# Patient Record
Sex: Male | Born: 1970 | Race: White | Hispanic: No | Smoking: Never smoker
Health system: Southern US, Community
[De-identification: ages and names within clinical notes are randomized; demographics above are authoritative.]

## PROBLEM LIST (undated history)

## (undated) DIAGNOSIS — Z789 Other specified health status: Secondary | ICD-10-CM

## (undated) HISTORY — PX: OTHER SURGICAL HISTORY: SHX169

## (undated) HISTORY — DX: Other specified health status: Z78.9

---

## 2003-08-13 ENCOUNTER — Inpatient Hospital Stay (HOSPITAL_COMMUNITY): Admission: EM | Admit: 2003-08-13 | Discharge: 2003-08-21 | Payer: Self-pay | Admitting: Emergency Medicine

## 2004-02-15 IMAGING — CT CT HEAD W/O CM
1 series · 16 of 30 positions shown, 20 images · non-contrast
Comparison: none

CLINICAL DATA: Cerebellar mass resection. 
CT HEAD WITHOUT CONTRAST 08/18/03
Comparison to 08/17/03. 
Left suboccipital craniectomy is again noted with stable postoperative changes and edema within the left cerebellum.  Thrombosed vessel within the left cerebellum on image 10 is stable.  Right frontal ventriculostomy catheter is again noted.  Stable ventricular size. 
IMPRESSION
Stable CT of the head.

[Series 2: trauma head · axial · 0.47mm/px · z∈[+168,+309]mm · 16 of 30 slices shown, 20 images]
[im 2/30  brain]
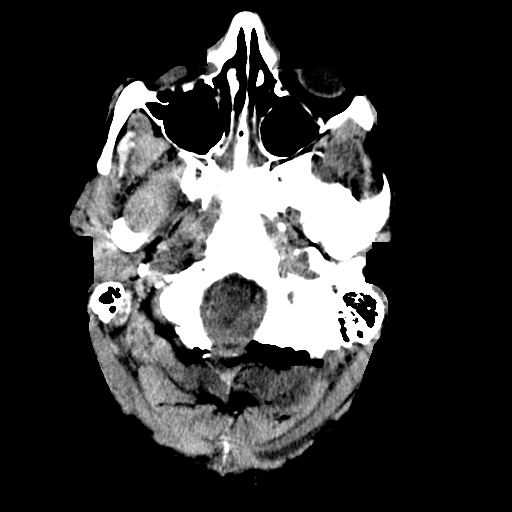
[im 2/30  bone]
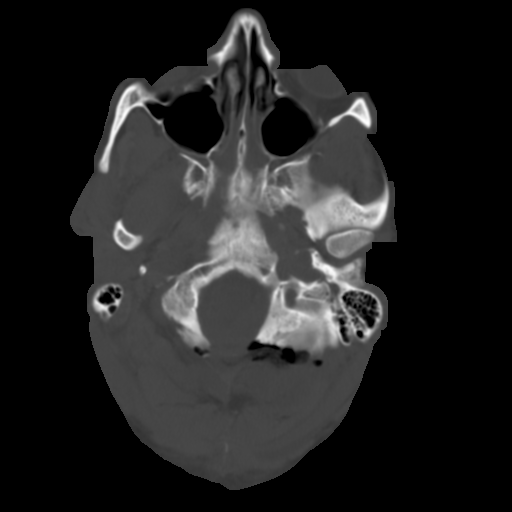
[im 4/30  brain]
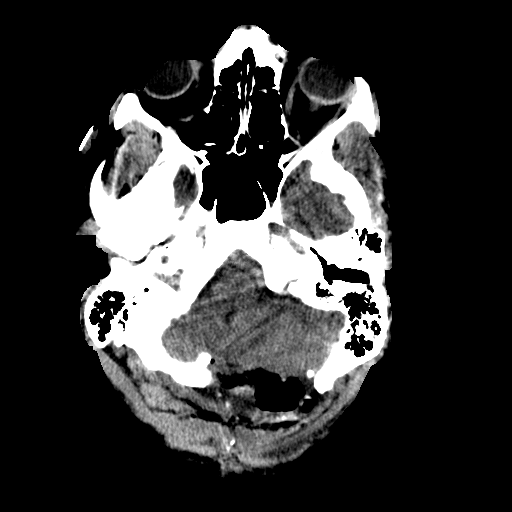
[im 6/30  brain]
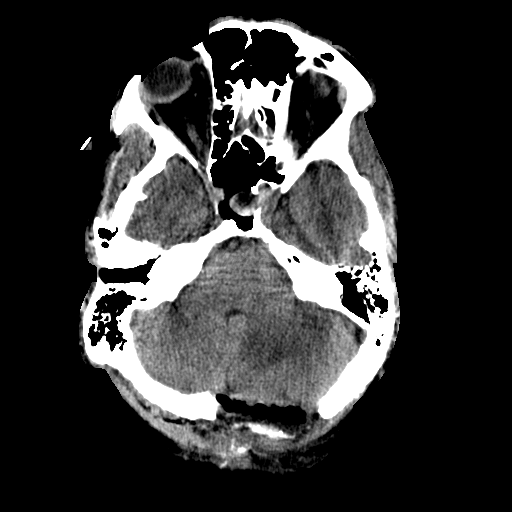
[im 8/30  brain]
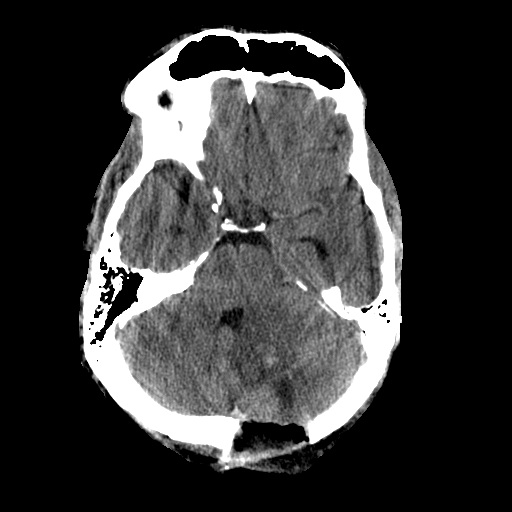
[im 9/30  brain]
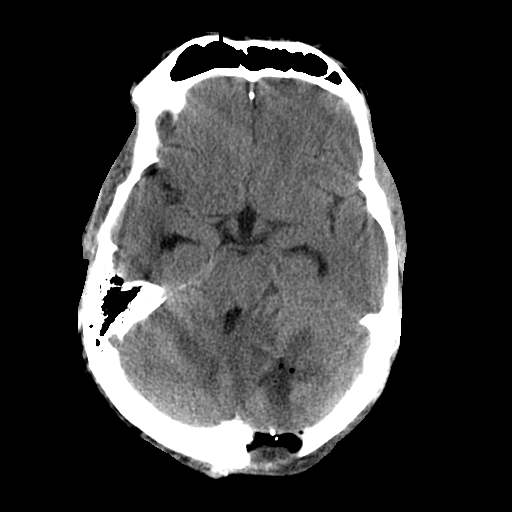
[im 9/30  bone]
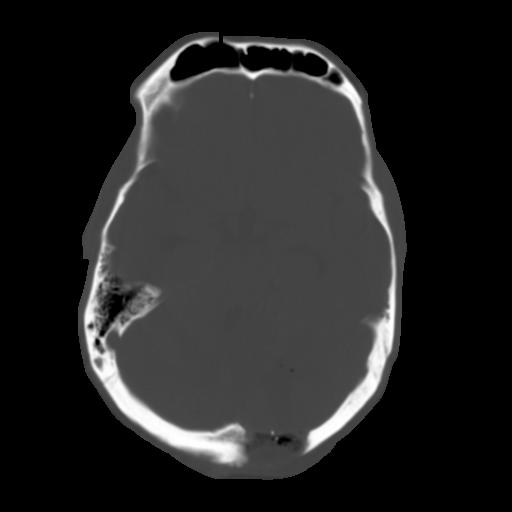
[im 11/30  brain]
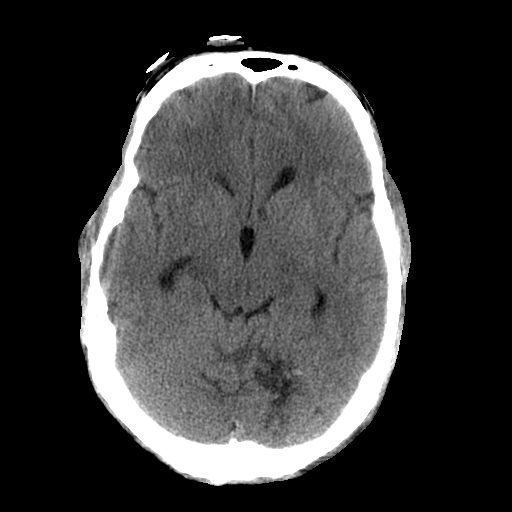
[im 13/30  brain]
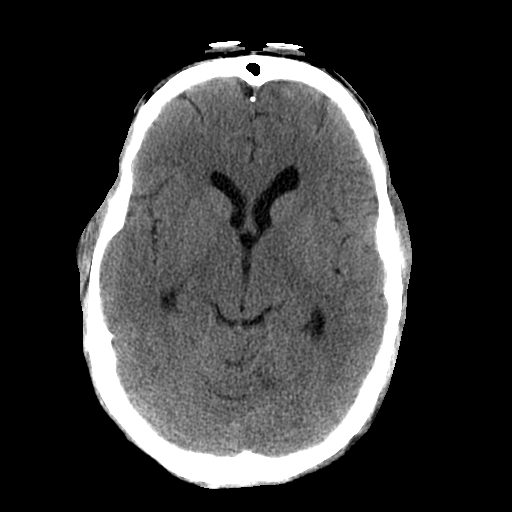
[im 15/30  brain]
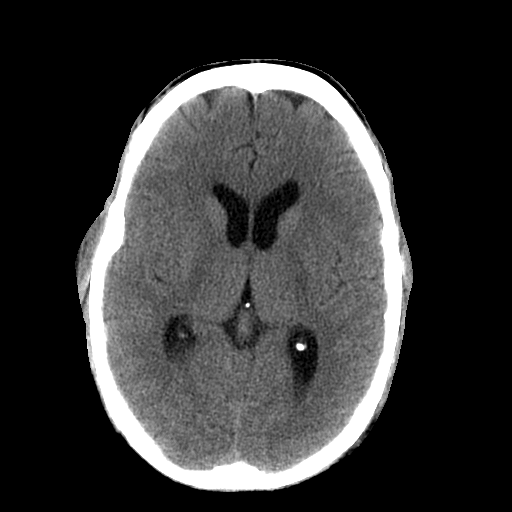
[im 16/30  brain]
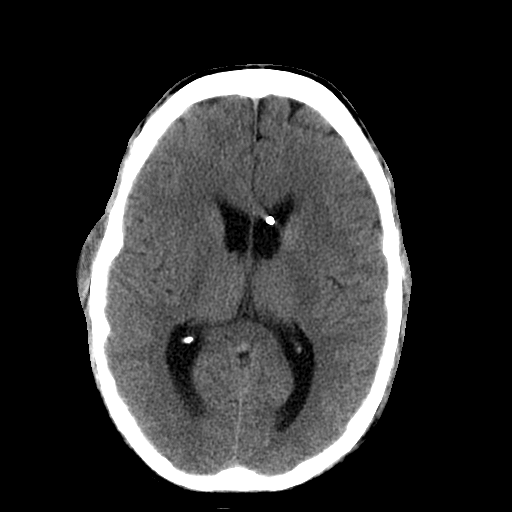
[im 16/30  bone]
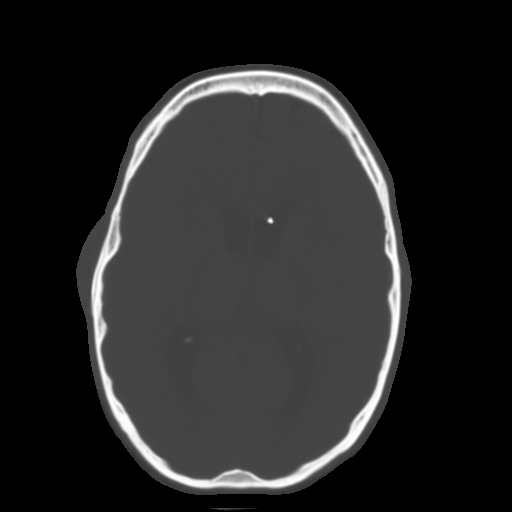
[im 18/30  brain]
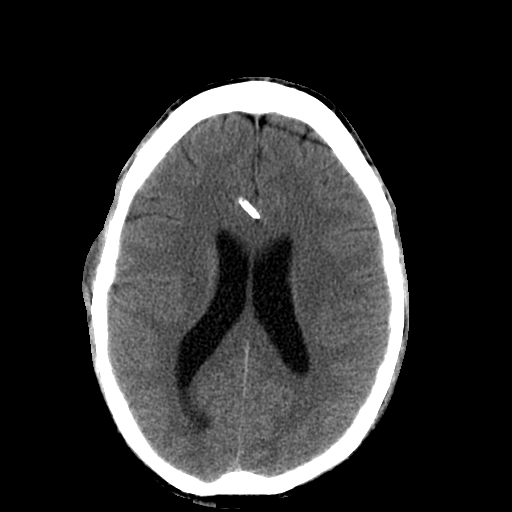
[im 20/30  brain]
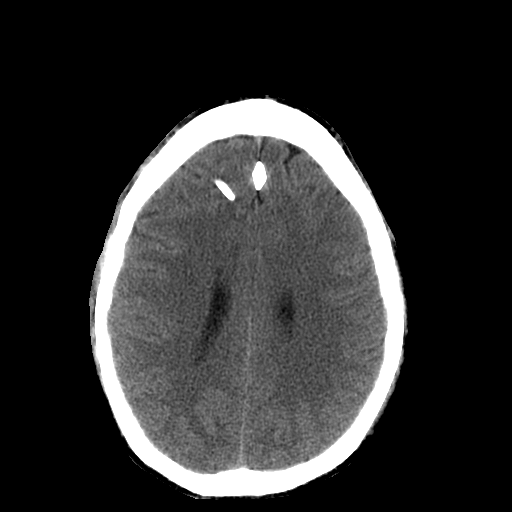
[im 22/30  brain]
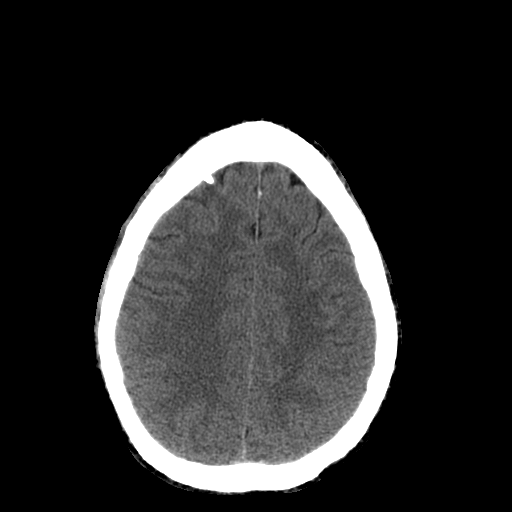
[im 23/30  brain]
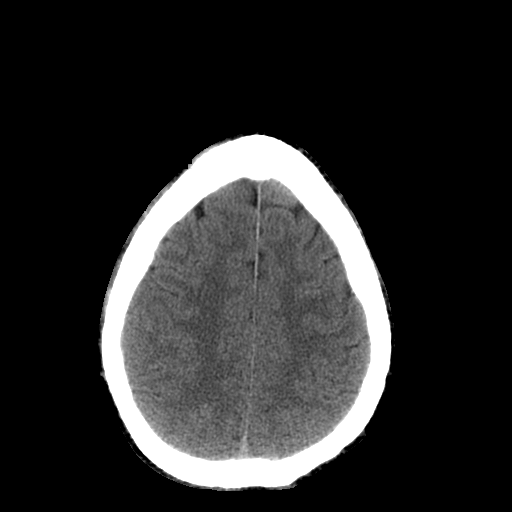
[im 23/30  bone]
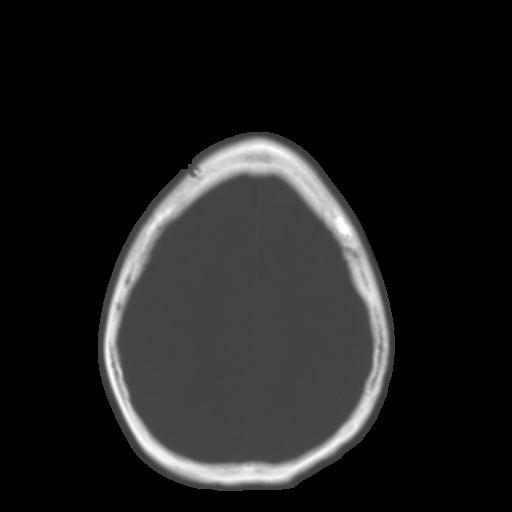
[im 25/30  brain]
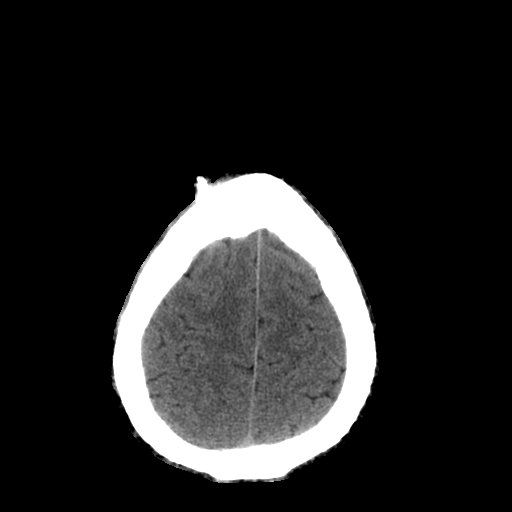
[im 27/30  brain]
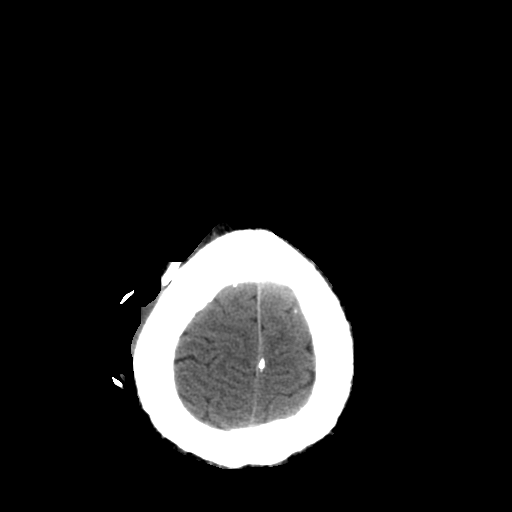
[im 29/30  brain]
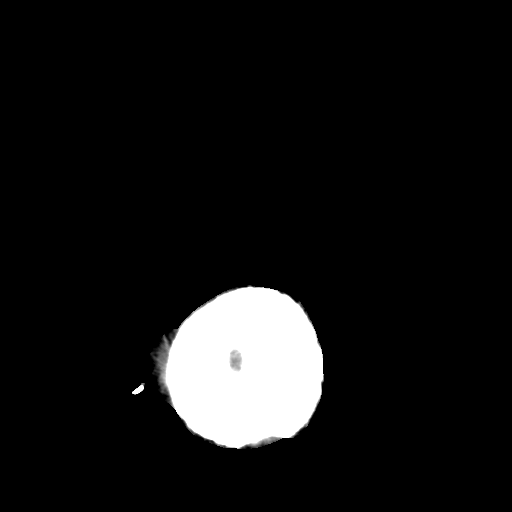

[16 of 30 positions shown; findings below may reference images not displayed]

## 2010-11-05 ENCOUNTER — Other Ambulatory Visit: Payer: Self-pay

## 2016-07-27 DIAGNOSIS — B070 Plantar wart: Secondary | ICD-10-CM | POA: Diagnosis not present

## 2017-04-14 DIAGNOSIS — N453 Epididymo-orchitis: Secondary | ICD-10-CM | POA: Diagnosis not present

## 2019-06-14 DIAGNOSIS — M545 Low back pain: Secondary | ICD-10-CM | POA: Diagnosis not present

## 2019-08-16 DIAGNOSIS — M545 Low back pain: Secondary | ICD-10-CM | POA: Diagnosis not present

## 2020-02-01 DIAGNOSIS — Z20828 Contact with and (suspected) exposure to other viral communicable diseases: Secondary | ICD-10-CM | POA: Diagnosis not present

## 2020-02-01 DIAGNOSIS — Z03818 Encounter for observation for suspected exposure to other biological agents ruled out: Secondary | ICD-10-CM | POA: Diagnosis not present

## 2020-03-18 DIAGNOSIS — Z03818 Encounter for observation for suspected exposure to other biological agents ruled out: Secondary | ICD-10-CM | POA: Diagnosis not present

## 2020-03-18 DIAGNOSIS — Z20822 Contact with and (suspected) exposure to covid-19: Secondary | ICD-10-CM | POA: Diagnosis not present

## 2022-01-14 DIAGNOSIS — Z9889 Other specified postprocedural states: Secondary | ICD-10-CM | POA: Diagnosis not present

## 2022-01-14 DIAGNOSIS — M1731 Unilateral post-traumatic osteoarthritis, right knee: Secondary | ICD-10-CM | POA: Diagnosis not present

## 2022-10-08 DIAGNOSIS — F411 Generalized anxiety disorder: Secondary | ICD-10-CM | POA: Diagnosis not present

## 2022-10-21 ENCOUNTER — Ambulatory Visit (INDEPENDENT_AMBULATORY_CARE_PROVIDER_SITE_OTHER): Payer: BC Managed Care – PPO | Admitting: Urology

## 2022-10-21 ENCOUNTER — Encounter: Payer: Self-pay | Admitting: Urology

## 2022-10-21 VITALS — BP 132/85 | HR 55 | Ht 73.0 in | Wt 210.0 lb

## 2022-10-21 DIAGNOSIS — Z3009 Encounter for other general counseling and advice on contraception: Secondary | ICD-10-CM

## 2022-10-21 MED ORDER — ALPRAZOLAM 1 MG PO TABS: ORAL_TABLET | ORAL | 0 refills | Status: AC

## 2022-10-21 NOTE — Progress Notes (Signed)
Assessment: 1. Encounter for vasectomy assessment     Plan: Rx for alprazolam pre-procedure provided Schedule for vasectomy per patient request  Chief Complaint:  Chief Complaint  Patient presents with   VAS Consult    History of Present Illness:  Jack Kelly is a 52 y.o. male who is seen for vasectomy evaluation.  He is married with 6 children.  His wife is 92 years old.  He has a remote history of epididymitis.  No history of scrotal trauma.   Past Medical History:  Past Medical History:  Diagnosis Date   No known health problems     Past Surgical History:  Past Surgical History:  Procedure Laterality Date   None      Allergies:  No Known Allergies  Family History:  History reviewed. No pertinent family history.  Social History:  Social History   Tobacco Use   Smoking status: Never   Smokeless tobacco: Never  Substance Use Topics   Alcohol use: Not Currently    Review of symptoms:  Constitutional:  Negative for unexplained weight loss, night sweats, fever, chills ENT:  Negative for nose bleeds, sinus pain, painful swallowing CV:  Negative for chest pain, shortness of breath, exercise intolerance, palpitations, loss of consciousness Resp:  Negative for cough, wheezing, shortness of breath GI:  Negative for nausea, vomiting, diarrhea, bloody stools GU:  Positives noted in HPI; otherwise negative for gross hematuria, dysuria, urinary incontinence Neuro:  Negative for seizures, poor balance, limb weakness, slurred speech Psych:  Negative for lack of energy, depression, anxiety Endocrine:  Negative for polydipsia, polyuria, symptoms of hypoglycemia (dizziness, hunger, sweating) Hematologic:  Negative for anemia, purpura, petechia, prolonged or excessive bleeding, use of anticoagulants  Allergic:  Negative for difficulty breathing or choking as a result of exposure to anything; no shellfish allergy; no allergic response (rash/itch) to materials,  foods  Physical exam: BP 132/85   Pulse (!) 55   Ht 6\' 1"  (1.854 m)   Wt 210 lb (95.3 kg)   BMI 27.71 kg/m  GENERAL APPEARANCE:  Well appearing, well developed, well nourished, NAD HEENT:  Atraumatic, normocephalic, oropharynx clear NECK:  Supple without lymphadenopathy or thyromegaly ABDOMEN:  Soft, non-tender, no masses EXTREMITIES:  Moves all extremities well, without clubbing, cyanosis, or edema NEUROLOGIC:  Alert and oriented x 3, normal gait, CN II-XII grossly intact MENTAL STATUS:  appropriate BACK:  Non-tender to palpation, No CVAT SKIN:  Warm, dry, and intact GU: Penis:  uncircumcised Meatus: Normal Scrotum: vas palpated bilaterally Testis: normal without masses bilateral Epididymis: normal    Results: None  VASECTOMY CONSULTATION  [name] presents for vasectomy consultation today.  He is a 52 y.o. male, Married with 6  children .  He and his wife have discussed the issues regarding long-term fertility and are comfortable with this decision.  He presents for consideration for vasectomy.  I discussed the issues in detail with him today and he expressed no reservations.  As to the procedure, no scalpel technique vasectomy is explained and reviewed in detail.  Generalized risks including but not limited to bleeding, infection, orchalgia, testicular atrophy, epididymitis, scrotal hematoma, and chronic pain are discussed.   Additionally, he understands that the possibility of vas recanalization following vasectomy is possible although rare.  Most importantly, the patient understands that he is not sterile initially and will need a semen analysis check to confirm sterility such that no sperm are seen.  He is advised to avoid ejaculation for 10 days following  the procedure.  The initial semen analysis will be checked in approximately 12 weeks and in some patients, several months may be required for clearance of all sperm.  He reports a clear understanding of the need for  continued birth control until sterility is confirmed.  Otherwise, general issues regarding local anesthesia, prep, alprazolam are discussed and he reports a clear understanding.

## 2022-10-21 NOTE — Patient Instructions (Signed)
Taking care of yourself after a VASECTOMY                                              Patient Information Sheet        The following information will reinforce some of the instructions that your doctor has given you.  Day of Procedure: 1) Wear the scrotal supporter and gauze pad 2) Use an ice pack on the scrotum for 15 minutes every hour for 48 hours to help reduce discomfort, swelling and bruising (do NOT place ice directly on your skin, but place on top of the supporter) 3) Expect some clear to pinkish drainage at the surgical site for the first 24-48 hours 4) If needed, use pain medications provided or ibuprofen 800 mg every 8 hours for discomfort 5) Avoid strenuous activities like mowing, lifting, jogging and exercising for 1 week.  Take it easy! 6) If you develop a fever over 101 F or sudden onset of significant swelling within the first 12 hours, please call to report this to your doctor as soon as possible.     Day Two and Three: 1) You may take a shower, but avoid tubs, pools or hot tubs. 2) Continue to wear the scrotal supporter as needed for comfort and change or remove the gauze pad if desired 3) Keep taking it easy!  Avoid strenuous activities like mowing, lifting, jogging and exercising.   4) Continue to watch for signs or symptoms of fever or significant swelling 5) Apply a small amount of antibiotic ointment to incision 1-2 times/day  The rest of the week: 1) Gradually return to normal physical activities after one week.  A return of soreness might mean you are        "doing too much too soon". 2) Avoid sexual activity for 10 days after the procedure 3) Continue to take a shower, but avoid tubs, pools or hot tubs 4) Wearing the scrotal supporter is optional based on your comfort.     Remember to use an alternate form of contraception for 3 months until you have been checked and CLEARED by your urologist!   

## 2022-11-15 ENCOUNTER — Encounter: Payer: Self-pay | Admitting: Urology

## 2022-11-15 ENCOUNTER — Ambulatory Visit (INDEPENDENT_AMBULATORY_CARE_PROVIDER_SITE_OTHER): Payer: BC Managed Care – PPO | Admitting: Urology

## 2022-11-15 VITALS — BP 107/69 | HR 58 | Ht 73.0 in | Wt 210.0 lb

## 2022-11-15 DIAGNOSIS — Z302 Encounter for sterilization: Secondary | ICD-10-CM

## 2022-11-15 MED ORDER — HYDROCODONE-ACETAMINOPHEN 5-325 MG PO TABS: 1.0000 | ORAL_TABLET | Freq: Four times a day (QID) | ORAL | 0 refills | Status: AC | PRN

## 2022-11-15 MED ORDER — CEPHALEXIN 500 MG PO CAPS: 500.0000 mg | ORAL_CAPSULE | Freq: Three times a day (TID) | ORAL | 0 refills | Status: AC

## 2022-11-15 NOTE — Patient Instructions (Signed)
Taking care of yourself after a VASECTOMY                                              Patient Information Sheet        The following information will reinforce some of the instructions that your doctor has given you.  Day of Procedure: 1) Wear the scrotal supporter and gauze pad 2) Use an ice pack on the scrotum for 15 minutes every hour for 48 hours to help reduce discomfort, swelling and bruising (do NOT place ice directly on your skin, but place on top of the supporter) 3) Expect some clear to pinkish drainage at the surgical site for the first 24-48 hours 4) If needed, use pain medications provided or ibuprofen 800 mg every 8 hours for discomfort 5) Avoid strenuous activities like mowing, lifting, jogging and exercising for 1 week.  Take it easy! 6) If you develop a fever over 101 F or sudden onset of significant swelling within the first 12 hours, please call to report this to your doctor as soon as possible.     Day Two and Three: 1) You may take a shower, but avoid tubs, pools or hot tubs. 2) Continue to wear the scrotal supporter as needed for comfort and change or remove the gauze pad if desired 3) Keep taking it easy!  Avoid strenuous activities like mowing, lifting, jogging and exercising.   4) Continue to watch for signs or symptoms of fever or significant swelling 5) Apply a small amount of antibiotic ointment to incision 1-2 times/day  The rest of the week: 1) Gradually return to normal physical activities after one week.  A return of soreness might mean you are        "doing too much too soon". 2) Avoid sexual activity for 10 days after the procedure 3) Continue to take a shower, but avoid tubs, pools or hot tubs 4) Wearing the scrotal supporter is optional based on your comfort.     Remember to use an alternate form of contraception for 3 months until you have been checked and CLEARED by your urologist!  51-Month lab appointment:  1) The lab technician will need to  look at a semen sample under a microscope  2) Use the specimen cup provided to collect the sample AT HOME 1 hour before the appointment  3) DO NOT refrigerate the specimen, but keep at room or body temperature  4) Avoid ejaculation for 2-5 days before collecting the specimen  5) Collect the entire specimen by masturbation using NO lubricant  6) Make sure your name, MR number, date and time of collection are on the cup

## 2022-11-15 NOTE — Progress Notes (Signed)
  Assessment: 1. Encounter for vasectomy     Plan: Post vasectomy instructions given Rx provided Post vasectomy semen analysis is 12 weeks  Chief Complaint:  Chief Complaint  Patient presents with   VAS    History of Present Illness:  Jack Kelly is a 52 y.o. male who is seen for vasectomy.  He is married with 6 children.  His wife is 49 years old.  He has a remote history of epididymitis.  No history of scrotal trauma.   Past Medical History:  Past Medical History:  Diagnosis Date   No known health problems     Past Surgical History:  Past Surgical History:  Procedure Laterality Date   None      Allergies:  No Known Allergies  Family History:  History reviewed. No pertinent family history.  Social History:  Social History   Tobacco Use   Smoking status: Never   Smokeless tobacco: Never  Substance Use Topics   Alcohol use: Not Currently    ROS: Constitutional:  Negative for fever, chills, weight loss CV: Negative for chest pain, previous MI, hypertension Respiratory:  Negative for shortness of breath, wheezing, sleep apnea, frequent cough GI:  Negative for nausea, vomiting, bloody stool, GERD  Physical exam: BP 107/69   Pulse (!) 58   Ht 6' 1"$  (1.854 m)   Wt 210 lb (95.3 kg)   BMI 27.71 kg/m  GENERAL APPEARANCE:  Well appearing, well developed, well nourished, NAD HEENT:  Atraumatic, normocephalic, oropharynx clear NECK:  Supple without lymphadenopathy or thyromegaly ABDOMEN:  Soft, non-tender, no masses EXTREMITIES:  Moves all extremities well, without clubbing, cyanosis, or edema NEUROLOGIC:  Alert and oriented x 3, normal gait, CN II-XII grossly intact MENTAL STATUS:  appropriate BACK:  Non-tender to palpation, No CVAT SKIN:  Warm, dry, and intact    Results: None  VASECTOMY PROCEDURE:  MARKEISE MONTVILLE presents for vasectomy following previous vasectomy consultation and permit is signed.  The patient's anterior scrotal wall is shaved and  prepped with Betadine in standard sterile fashion.  1% lidocaine is used as local anesthetic in the scrotal and peri vasal tissue.  A standard median raphe punch incision is made and a no scalpel technique vasectomy is performed.  Bilateral vas are isolated from the peri vasal tissue and an approximately 1 cm segment of vas is excised.  Proximal and distal segments are internally cauterized with electric heat cautery. Interposition of perivasal tissue was performed.  Bilateral palpation confirms bilateral vasectomy defect and no significant bleeding or hematoma is identified.  Neosporin gauze dressing and a scrotal support are applied.    Disposition: Patient is discharged home with Rx for pain medication and antibiotics.  Patient is given routine vasectomy instructions.   Most importantly, he is instructed and cautioned again regarding the need for protected intercourse until such time that a single  negative semen analysis has been obtained.  The initial semen analysis will be checked in approximately 12  weeks.  The patient reports a clear understanding.  He will call with any interval questions or concerns.

## 2022-12-06 ENCOUNTER — Telehealth: Payer: Self-pay | Admitting: Urology

## 2022-12-06 ENCOUNTER — Other Ambulatory Visit: Payer: Self-pay | Admitting: Urology

## 2022-12-06 MED ORDER — DOXYCYCLINE HYCLATE 100 MG PO CAPS: 100.0000 mg | ORAL_CAPSULE | Freq: Two times a day (BID) | ORAL | 0 refills | Status: AC

## 2022-12-06 MED ORDER — MELOXICAM 7.5 MG PO TABS: 7.5000 mg | ORAL_TABLET | Freq: Every day | ORAL | 1 refills | Status: AC

## 2022-12-06 NOTE — Telephone Encounter (Signed)
Patient had a Vasectomy done on 11/15/2022 & called stating that he is having some complications with this & having quite a bit of soreness and has some questions in regards to this and would like a call back.   Patients call back #: [phone]

## 2023-01-12 DIAGNOSIS — Z832 Family history of diseases of the blood and blood-forming organs and certain disorders involving the immune mechanism: Secondary | ICD-10-CM | POA: Diagnosis not present

## 2023-01-12 DIAGNOSIS — F411 Generalized anxiety disorder: Secondary | ICD-10-CM | POA: Diagnosis not present

## 2023-01-12 DIAGNOSIS — R4189 Other symptoms and signs involving cognitive functions and awareness: Secondary | ICD-10-CM | POA: Diagnosis not present

## 2023-01-18 ENCOUNTER — Other Ambulatory Visit: Payer: Self-pay

## 2023-01-18 DIAGNOSIS — Z302 Encounter for sterilization: Secondary | ICD-10-CM

## 2023-02-03 ENCOUNTER — Other Ambulatory Visit: Payer: BC Managed Care – PPO

## 2023-02-07 ENCOUNTER — Other Ambulatory Visit: Payer: BC Managed Care – PPO

## 2023-02-08 ENCOUNTER — Other Ambulatory Visit: Payer: BC Managed Care – PPO

## 2023-02-10 ENCOUNTER — Other Ambulatory Visit: Payer: Self-pay

## 2023-02-10 ENCOUNTER — Other Ambulatory Visit: Payer: BC Managed Care – PPO

## 2023-02-10 DIAGNOSIS — Z302 Encounter for sterilization: Secondary | ICD-10-CM | POA: Diagnosis not present

## 2023-02-11 ENCOUNTER — Telehealth: Payer: Self-pay | Admitting: Urology

## 2023-02-11 LAB — POST-VAS SPERM EVALUATION,QUAL: Volume: 3.2 mL

## 2023-02-11 NOTE — Telephone Encounter (Signed)
Pt seeking semen analysis results. Advised pt results are not back yet, we will notify him when they are. Pt expressed understanding.

## 2023-02-11 NOTE — Telephone Encounter (Signed)
Patient called in regards to his lab results & wanted a call back about this.   Patients callback #: [phone]

## 2023-03-02 DIAGNOSIS — G4719 Other hypersomnia: Secondary | ICD-10-CM | POA: Diagnosis not present

## 2023-04-21 DIAGNOSIS — F411 Generalized anxiety disorder: Secondary | ICD-10-CM | POA: Diagnosis not present

## 2023-04-21 DIAGNOSIS — G4733 Obstructive sleep apnea (adult) (pediatric): Secondary | ICD-10-CM | POA: Diagnosis not present

## 2023-07-09 DIAGNOSIS — F4320 Adjustment disorder, unspecified: Secondary | ICD-10-CM | POA: Diagnosis not present

## 2023-07-15 DIAGNOSIS — F4320 Adjustment disorder, unspecified: Secondary | ICD-10-CM | POA: Diagnosis not present

## 2023-07-25 DIAGNOSIS — F4320 Adjustment disorder, unspecified: Secondary | ICD-10-CM | POA: Diagnosis not present

## 2023-11-08 DIAGNOSIS — F4320 Adjustment disorder, unspecified: Secondary | ICD-10-CM | POA: Diagnosis not present

## 2023-11-21 DIAGNOSIS — F4321 Adjustment disorder with depressed mood: Secondary | ICD-10-CM | POA: Diagnosis not present

## 2023-12-01 DIAGNOSIS — F4320 Adjustment disorder, unspecified: Secondary | ICD-10-CM | POA: Diagnosis not present

## 2023-12-01 DIAGNOSIS — F4321 Adjustment disorder with depressed mood: Secondary | ICD-10-CM | POA: Diagnosis not present

## 2023-12-12 DIAGNOSIS — F4320 Adjustment disorder, unspecified: Secondary | ICD-10-CM | POA: Diagnosis not present

## 2023-12-15 DIAGNOSIS — F4321 Adjustment disorder with depressed mood: Secondary | ICD-10-CM | POA: Diagnosis not present

## 2023-12-22 DIAGNOSIS — F4320 Adjustment disorder, unspecified: Secondary | ICD-10-CM | POA: Diagnosis not present

## 2023-12-28 DIAGNOSIS — F4321 Adjustment disorder with depressed mood: Secondary | ICD-10-CM | POA: Diagnosis not present

## 2024-01-05 DIAGNOSIS — F4320 Adjustment disorder, unspecified: Secondary | ICD-10-CM | POA: Diagnosis not present

## 2024-01-19 DIAGNOSIS — F4320 Adjustment disorder, unspecified: Secondary | ICD-10-CM | POA: Diagnosis not present

## 2024-01-30 DIAGNOSIS — F4320 Adjustment disorder, unspecified: Secondary | ICD-10-CM | POA: Diagnosis not present

## 2024-02-07 DIAGNOSIS — F4321 Adjustment disorder with depressed mood: Secondary | ICD-10-CM | POA: Diagnosis not present

## 2024-02-14 DIAGNOSIS — F4320 Adjustment disorder, unspecified: Secondary | ICD-10-CM | POA: Diagnosis not present

## 2024-02-22 DIAGNOSIS — F4320 Adjustment disorder, unspecified: Secondary | ICD-10-CM | POA: Diagnosis not present

## 2024-02-24 DIAGNOSIS — F4321 Adjustment disorder with depressed mood: Secondary | ICD-10-CM | POA: Diagnosis not present

## 2024-03-02 DIAGNOSIS — F4320 Adjustment disorder, unspecified: Secondary | ICD-10-CM | POA: Diagnosis not present

## 2024-03-16 DIAGNOSIS — F4320 Adjustment disorder, unspecified: Secondary | ICD-10-CM | POA: Diagnosis not present

## 2024-04-05 DIAGNOSIS — F4320 Adjustment disorder, unspecified: Secondary | ICD-10-CM | POA: Diagnosis not present

## 2024-05-03 DIAGNOSIS — F4320 Adjustment disorder, unspecified: Secondary | ICD-10-CM | POA: Diagnosis not present

## 2024-05-29 ENCOUNTER — Ambulatory Visit: Payer: Self-pay

## 2024-05-29 NOTE — Telephone Encounter (Signed)
 FYI Only or Action Required?: FYI only for provider.  Patient was last seen in primary care on n/a.  Called Nurse Triage reporting Suicidal.  Symptoms began Friday.  Interventions attempted: Nothing.  Symptoms are: gradually worsening.  Triage Disposition: Go to ED Now (Notify PCP)  Patient/caregiver understands and will follow disposition?: Yes   Copied from CRM [id number]. Topic: Clinical - Red Word Triage >> May 29, 2024 12:12 PM Thersia BROCKS wrote: [facility] that prompted transfer to Nurse Triage: Patient spouse, [place] called in stated patient has been showing symptoms of being manic , expressing suicidal thoughts and stating that he needs help. She was not sure who she needs to speak with but wanted to have some guidance on what she should do, Patient is not there at the moment Reason for Disposition  [1] Depression symptoms (sadness, hopelessness, decreased energy) AND [2] unable to do any normal activities (e.g., self-care, school, work; in comparison to baseline).  Answer Assessment - Initial Assessment Questions 1. MAIN CONCERN: What happened that made you call today?     Pt has been expressing suicidal thoughts  2. RISK OF HARM - SUICIDAL IDEATION:  Do you ever have thoughts of hurting or killing yourself?  (e.g., yes, no, no but preoccupation with thoughts about death)     Pt actively stating his family would be better off without him, stating family does not need him 3. RISK OF HARM - SUICIDE ATTEMPT: Have you tried to harm yourself recently? If Yes, ask: When was this?  What type of harm was tried?     no 4. RISK OF HARM - SUICIDAL BEHAVIOR: Have you ever done anything, started to do anything, or prepared to do anything to end your life? (e.g., collected pills, bought a gun, wrote a suicide note, cut yourself, started but changed your mind)     Guns in home  5. EVENTS AND STRESSORS: Has there been any new stress or recent changes in your life? (e.g., death of loved  one, homelessness, negative event, relationship breakup, work)     no 6. FUNCTIONAL IMPAIRMENT: How have things been going for you overall? Have you had more difficulty than usual doing your normal daily activities?  (e.g., better, same, worse; self-care, school, work, interactions)     na 7. SUPPORT: Who is with you now? Who do you live with? Do you have family or friends who you can talk to?      Yes wife 8. THERAPIST: Do you have a counselor or therapist? If Yes, ask: What is their name?     no 9. ALCOHOL USE OR SUBSTANCE USE (DRUG USE): Do you drink alcohol or use any illegal drugs (or prescription drugs in ways other than prescribed)?     no 10. OTHER: Do you have any other physical symptoms right now? (e.g., fever)       na 11. PREGNANCY or POSTPARTUM: Is there any chance you are pregnant? When was your last menstrual period? Were you recently pregnant? When did you give birth?       Na  Sunday pt made wife and daughter get out of the car due to having concealed carry on him: it was a work gun.  Wife called concerned because husband has been real down since Friday: pt has not stated he has a plan.  Nurse recommend wife take husband to ED for evaluation or call 911. Also provided wife with [phone] number.  Wife stated pt not home at present time: he  is working at the moment.  Protocols used: Suicide Concerns-A-AH

## 2024-07-03 DIAGNOSIS — M7551 Bursitis of right shoulder: Secondary | ICD-10-CM | POA: Diagnosis not present

## 2024-07-03 DIAGNOSIS — M25511 Pain in right shoulder: Secondary | ICD-10-CM | POA: Diagnosis not present

## 2024-07-03 DIAGNOSIS — M7521 Bicipital tendinitis, right shoulder: Secondary | ICD-10-CM | POA: Diagnosis not present

## 2024-07-19 DIAGNOSIS — F4320 Adjustment disorder, unspecified: Secondary | ICD-10-CM | POA: Diagnosis not present

## 2024-07-30 DIAGNOSIS — E559 Vitamin D deficiency, unspecified: Secondary | ICD-10-CM | POA: Diagnosis not present

## 2024-07-30 DIAGNOSIS — F411 Generalized anxiety disorder: Secondary | ICD-10-CM | POA: Diagnosis not present

## 2024-08-14 DIAGNOSIS — M7551 Bursitis of right shoulder: Secondary | ICD-10-CM | POA: Diagnosis not present

## 2024-09-06 DIAGNOSIS — G4733 Obstructive sleep apnea (adult) (pediatric): Secondary | ICD-10-CM | POA: Diagnosis not present

## 2024-09-10 DIAGNOSIS — F419 Anxiety disorder, unspecified: Secondary | ICD-10-CM | POA: Diagnosis not present

## 2024-09-10 DIAGNOSIS — G4733 Obstructive sleep apnea (adult) (pediatric): Secondary | ICD-10-CM | POA: Diagnosis not present
